# Patient Record
Sex: Female | Born: 2005 | Race: White | Hispanic: No | Marital: Single | Smoking: Never smoker
Health system: Southern US, Community
[De-identification: ages and names within clinical notes are randomized; demographics above are authoritative.]

---
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
## 2023-06-07 DIAGNOSIS — L814 Other melanin hyperpigmentation: Secondary | ICD-10-CM | POA: Diagnosis not present
